# Patient Record
Sex: Male | Born: 2000 | Race: White | Hispanic: No | Marital: Single | Smoking: Never smoker
Health system: Southern US, Community
[De-identification: ages and names within clinical notes are randomized; demographics above are authoritative.]

## PROBLEM LIST (undated history)

## (undated) DIAGNOSIS — F988 Other specified behavioral and emotional disorders with onset usually occurring in childhood and adolescence: Secondary | ICD-10-CM

## (undated) HISTORY — PX: NOSE SURGERY: SHX723

---
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
## 2019-03-24 ENCOUNTER — Other Ambulatory Visit: Payer: BLUE CROSS/BLUE SHIELD

## 2019-03-24 ENCOUNTER — Other Ambulatory Visit: Payer: Self-pay

## 2019-03-24 DIAGNOSIS — Z20822 Contact with and (suspected) exposure to covid-19: Secondary | ICD-10-CM

## 2019-03-27 LAB — NOVEL CORONAVIRUS, NAA: SARS-CoV-2, NAA: NOT DETECTED

## 2021-06-17 ENCOUNTER — Other Ambulatory Visit: Payer: Self-pay

## 2021-06-17 ENCOUNTER — Emergency Department
Admission: EM | Admit: 2021-06-17 | Discharge: 2021-06-18 | Disposition: A | Discharge status: Home / Self Care | Payer: Self-pay | Attending: Emergency Medicine | Admitting: Emergency Medicine

## 2021-06-17 DIAGNOSIS — T402X1A Poisoning by other opioids, accidental (unintentional), initial encounter: Secondary | ICD-10-CM | POA: Insufficient documentation

## 2021-06-17 MED ORDER — NALOXONE HCL 0.4 MG/ML IJ SOLN: INTRAMUSCULAR | 0 refills | Status: AC

## 2021-06-17 NOTE — ED Notes (Signed)
Pt given lunch box & drink - Aware of plan of care. Agreeable at this time. Call light in reach. On ETCO2 & cardiac monitoring.    Denies needs at this time.

## 2021-06-17 NOTE — ED Provider Notes (Signed)
[facility] Emergency Department Provider Note  ____________________________________________   Event Date/Time   First MD Initiated Contact with Patient 06/17/21 2157     (approximate)  I have reviewed the triage vital signs and the nursing notes.   HISTORY  Chief Complaint Drug Overdose   HPI Stuart Reynolds is a 20 y.o. male without significant past medical history presents via EMS after he was found slumped over in his car.  He reportedly was unresponsive and received 2 mg of intranasal Narcan, 2 mg of IV Narcan and did not wake up until receiving his second dose.  Patient states he was attempting to get and adamantly denies any SI or attempt to harm himself.  He denies any daily opioid or illicit drug use.  States he feels bad about what happened.  He denies any recent sick symptoms including fevers, chills, cough, nausea, vomiting, diarrhea, dysuria, rash or recent injuries or falls or other recent overdose overdose.  Denies any HI or hallucinations.  Denies any other acute concerns at this time.         Past Medical History:  Diagnosis Date   ADD (attention deficit disorder)     There are no problems to display for this patient.   Past Surgical History:  Procedure Laterality Date   NOSE SURGERY      Prior to Admission medications   Medication Sig Start Date End Date Taking? Authorizing Provider  naloxone [facility]) 0.4 MG/ML injection Administer for any concern for opioid overdose 06/17/21  Yes Gilles Chiquito, MD  Alum & Mag Hydroxide-Simeth (GI COCKTAIL) SUSP suspension Take 20 mLs by mouth 3 (three) times daily as needed for indigestion. Please mix maalox and viscous lidocaine in 1:1 solution. Do not add Donnatal. 02/08/17   Lavera Guise, MD  amphetamine-dextroamphetamine (ADDERALL XR) 10 MG 24 hr capsule Take 10 mg by mouth daily as needed (for hyperactivity).    [provider]  omeprazole (PRILOSEC) 20 MG capsule Take 20 mg by  mouth daily before breakfast.    [provider]  pantoprazole (PROTONIX) 20 MG tablet Take 1 tablet (20 mg total) by mouth daily. 02/08/17   Lavera Guise, MD  sucralfate (CARAFATE) 1 g tablet Take 1 tablet (1 g total) by mouth 4 (four) times daily -  with meals and at bedtime. 02/08/17   Lavera Guise, MD    Allergies Patient has no known allergies.  History reviewed. No pertinent family history.  Social History Social History   Tobacco Use   Smoking status: Never   Smokeless tobacco: Never  Vaping Use   Vaping Use: Every day  Substance Use Topics   Alcohol use: Yes    Comment: occasional   Drug use: Yes    Types: Marijuana    Comment: occasional    Review of Systems  Review of Systems  Constitutional:  Negative for chills and fever.  HENT:  Negative for sore throat.   Eyes:  Negative for pain.  Respiratory:  Negative for cough and stridor.   Cardiovascular:  Negative for chest pain.  Gastrointestinal:  Negative for vomiting.  Genitourinary:  Negative for dysuria.  Musculoskeletal:  Negative for myalgias.  Skin:  Negative for rash.  Neurological:  Negative for seizures, loss of consciousness and headaches.  Psychiatric/Behavioral:  Positive for substance abuse. Negative for suicidal ideas.   All other systems reviewed and are negative.    ____________________________________________   PHYSICAL EXAM:  VITAL SIGNS: ED Triage Vitals [  06/17/21 2154]  Enc Vitals Group     BP      Pulse      Resp      Temp      Temp src      SpO2      Weight 160 lb (72.6 kg)     Height 6\' 3"  (1.905 m)     Head Circumference      Peak Flow      Pain Score 0     Pain Loc      Pain Edu?      Excl. in GC?    Vitals:   06/17/21 2157  BP: (!) 167/104  Pulse: 93  Resp: 14  Temp: 98.4 F (36.9 C)  SpO2: 98%   Physical Exam Vitals and nursing note reviewed.  Constitutional:      Appearance: He is well-developed.  HENT:     Head: Normocephalic and atraumatic.      Right Ear: External ear normal.     Left Ear: External ear normal.     Nose: Nose normal.  Eyes:     Conjunctiva/sclera: Conjunctivae normal.  Cardiovascular:     Rate and Rhythm: Normal rate and regular rhythm.     Heart sounds: No murmur heard. Pulmonary:     Effort: Pulmonary effort is normal. No respiratory distress.     Breath sounds: Normal breath sounds.  Abdominal:     Palpations: Abdomen is soft.     Tenderness: There is no abdominal tenderness.  Musculoskeletal:     Cervical back: Neck supple.  Skin:    General: Skin is warm and dry.     Capillary Refill: Capillary refill takes less than 2 seconds.  Neurological:     Mental Status: He is alert and oriented to person, place, and time.  Psychiatric:        Mood and Affect: Mood is not anxious or depressed.        Thought Content: Thought content does not include homicidal or suicidal ideation. Thought content does not include suicidal plan.        Judgment: Judgment is impulsive.     ____________________________________________   LABS (all labs ordered are listed, but only abnormal results are displayed)  Labs Reviewed - No data to display ____________________________________________  EKG  ____________________________________________  RADIOLOGY  ED MD interpretation:    Official radiology report(s): No results found.  ____________________________________________   PROCEDURES  Procedure(s) performed (including Critical Care):  .1-3 Lead EKG Interpretation Performed by: [id number], MD Authorized by: Gilles Chiquito, MD     Interpretation: normal     ECG rate assessment: normal     Rhythm: sinus rhythm     Ectopy: none     Conduction: normal     ____________________________________________   INITIAL IMPRESSION / ASSESSMENT AND PLAN / ED COURSE      Patient presents with above-stated history exam after being found unresponsive by EMS and becoming awake and oriented after receiving  IV Narcan.  On arrival he is afebrile hemodynamically stable to slightly upper tensive.  He denies any acute sick symptoms and states he was trying to get high by snorting some fentanyl.  He is denying any headache, earache, sore throat vertigo or any SI HI or hallucinations.  He is calm and does not appear acutely intoxicated.  I suspect likely accidental opioid overdose.  Low suspicion for intentional overdose or other significant metabolic derangement at this time.  No history or exam  features to suggest recent trauma or infectious process.  We will plan to observe for 3 hours and if patient does not require any repeat doses of Narcan he will likely be stable for discharge with outpatient follow-up.  I did counsel patient on dangers of opioid abuse and recommended close outpatient PCP follow-up.  Care patient signed over to oncoming rider approximately 11 PM.  Plan is to observe for 3 hours and if patient does not require redose of Narcan discharge.        ____________________________________________   FINAL CLINICAL IMPRESSION(S) / ED DIAGNOSES  Final diagnoses:  Opioid overdose, accidental or unintentional, initial encounter (HCC)    Medications - No data to display   ED Discharge Orders          Ordered    naloxone (NARCAN) 0.4 MG/ML injection        06/17/21 2212             Note:  This document was prepared using Dragon voice recognition software and may include unintentional dictation errors.    Gilles Chiquito, MD 06/17/21 2212

## 2021-06-17 NOTE — ED Triage Notes (Addendum)
Pt presents to ER via ems after being found slumped over in his car.  Pt states he may have accidentally taken some fentanyl in a tablet form.  EMS administered 4 mg IM narcan, 2 mg intranasal narcan and 2 mg IV narcan.  Pt only responded after last 2 mg IV narcan.  Pt currently A&O x4.  Pt denies SI/HI.

## 2021-06-18 NOTE — ED Provider Notes (Signed)
Monitored for 3.5 hours post Narcan with no recurrence of sedation or respiratory depression.  Patient remains awake, alert and oriented x3 with a GCS of 15.  Ambulating with no difficulties.  Clinically sober.  Will discharge to the care of family.   Don Perking, [place], MD 06/18/21 [phone]

## 2021-06-18 NOTE — ED Notes (Signed)
Pt assisted to lay back in bed by this RN. Pt provided with water and sprite per his request. Pt awakens easily with mild stimuli, remains calm and cooperative with staff at this time. Pt denies further needs. Reoriented patient to call bell and instructed to use if further needs arise. Pt states understanding, denies further needs. VSS at this time.

## 2022-12-25 DEATH — deceased
